# Patient Record
Sex: Male | Born: 2005 | Race: White | Hispanic: No | Marital: Single | Smoking: Never smoker
Health system: Southern US, Community
[De-identification: ages and names within clinical notes are randomized; demographics above are authoritative.]

## PROBLEM LIST (undated history)

## (undated) DIAGNOSIS — F419 Anxiety disorder, unspecified: Secondary | ICD-10-CM

## (undated) DIAGNOSIS — J309 Allergic rhinitis, unspecified: Secondary | ICD-10-CM

## (undated) DIAGNOSIS — L309 Dermatitis, unspecified: Secondary | ICD-10-CM

## (undated) DIAGNOSIS — F32A Depression, unspecified: Secondary | ICD-10-CM

## (undated) DIAGNOSIS — Z0101 Encounter for examination of eyes and vision with abnormal findings: Secondary | ICD-10-CM

## (undated) HISTORY — DX: Allergic rhinitis, unspecified: J30.9

## (undated) HISTORY — DX: Dermatitis, unspecified: L30.9

## (undated) HISTORY — PX: CIRCUMCISION: SUR203

## (undated) HISTORY — DX: Encounter for examination of eyes and vision with abnormal findings: Z01.01

## (undated) HISTORY — DX: Anxiety disorder, unspecified: F41.9

## (undated) HISTORY — DX: Depression, unspecified: F32.A

---
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
## 2024-03-08 ENCOUNTER — Ambulatory Visit: Admitting: Pediatrics

## 2024-03-08 DIAGNOSIS — Z0000 Encounter for general adult medical examination without abnormal findings: Secondary | ICD-10-CM

## 2024-03-28 ENCOUNTER — Encounter: Admitting: Pediatrics

## 2024-03-29 ENCOUNTER — Encounter: Admitting: Pediatrics

## 2024-03-30 ENCOUNTER — Telehealth: Payer: Self-pay | Admitting: Pediatrics

## 2024-03-30 NOTE — Telephone Encounter (Signed)
 Patient stated he forgot about upcoming appointment. Rescheduled for the next available day.   Parent informed of No Show Policy. No Show Policy states that a patient may be dismissed from the practice after 3 missed well check appointments in a rolling calendar year. No show appointments are well child check appointments that are missed (no show or cancelled/rescheduled < 24hrs prior to appointment). The parent(s)/guardian will be notified of each missed appointment. The office administrator will review the chart prior to a decision being made. If a patient is dismissed due to No Shows, [facility] will continue to see that patient for 30 days for sick visits. Parent/caregiver verbalized understanding of policy.

## 2024-04-02 ENCOUNTER — Ambulatory Visit: Payer: Self-pay | Admitting: Pediatrics

## 2024-04-02 ENCOUNTER — Encounter: Payer: Self-pay | Admitting: Pediatrics

## 2024-04-02 VITALS — BP 124/68 | Ht 68.0 in | Wt 124.0 lb

## 2024-04-02 DIAGNOSIS — F902 Attention-deficit hyperactivity disorder, combined type: Secondary | ICD-10-CM

## 2024-04-02 MED ORDER — LISDEXAMFETAMINE DIMESYLATE 50 MG PO CAPS: 50.0000 mg | ORAL_CAPSULE | Freq: Every day | ORAL | 0 refills | Status: DC

## 2024-04-02 NOTE — Progress Notes (Signed)
 ADHD meds refilled after normal weight and Blood pressure. Doing well on present dose. See again in 3 months.  Meds ordered this encounter  Medications   lisdexamfetamine (VYVANSE ) 50 MG capsule    Sig: Take 1 capsule (50 mg total) by mouth daily.    Dispense:  30 capsule    Refill:  0   lisdexamfetamine (VYVANSE ) 50 MG capsule    Sig: Take 1 capsule (50 mg total) by mouth daily.    Dispense:  30 capsule    Refill:  0    DO NOT FILL PRIOR TO 05/02/24   lisdexamfetamine (VYVANSE ) 50 MG capsule    Sig: Take 1 capsule (50 mg total) by mouth daily.    Dispense:  30 capsule    Refill:  0    DO NOT FILL PRIOR TO 06/02/24

## 2024-04-02 NOTE — Patient Instructions (Signed)

## 2024-04-09 ENCOUNTER — Telehealth: Payer: Self-pay | Admitting: Pediatrics

## 2024-04-09 MED ORDER — LISDEXAMFETAMINE DIMESYLATE 50 MG PO CAPS: 50.0000 mg | ORAL_CAPSULE | Freq: Every day | ORAL | 0 refills | Status: DC

## 2024-04-09 NOTE — Telephone Encounter (Signed)
 Father called requesting medication (Vyvanse ) be sent to another pharmacy.   Medication: lisdexamfetamine (Vyvanse )  New Pharmacy: [facility] [address]

## 2024-04-09 NOTE — Telephone Encounter (Signed)
 Medication sent to [facility] [address]

## 2024-07-03 ENCOUNTER — Ambulatory Visit: Payer: Self-pay | Admitting: Pediatrics

## 2024-07-03 VITALS — BP 136/78 | Ht 67.0 in | Wt 120.2 lb

## 2024-07-03 DIAGNOSIS — F902 Attention-deficit hyperactivity disorder, combined type: Secondary | ICD-10-CM

## 2024-07-04 ENCOUNTER — Encounter: Payer: Self-pay | Admitting: Pediatrics

## 2024-07-04 MED ORDER — LISDEXAMFETAMINE DIMESYLATE 50 MG PO CAPS: 50.0000 mg | ORAL_CAPSULE | Freq: Every day | ORAL | 0 refills | Status: AC

## 2024-07-04 MED ORDER — LISDEXAMFETAMINE DIMESYLATE 50 MG PO CAPS: 50.0000 mg | ORAL_CAPSULE | Freq: Every day | ORAL | 0 refills | Status: DC

## 2024-07-04 NOTE — Progress Notes (Signed)
 ADHD meds refilled after normal weight and Blood pressure. Doing well on present dose. See again in 3 months.   Meds ordered this encounter  Medications   lisdexamfetamine  (VYVANSE ) 50 MG capsule    Sig: Take 1 capsule (50 mg total) by mouth daily.    Dispense:  30 capsule    Refill:  0    DO NOT FILL PRIOR TO 08/01/2024   lisdexamfetamine  (VYVANSE ) 50 MG capsule    Sig: Take 1 capsule (50 mg total) by mouth daily.    Dispense:  30 capsule    Refill:  0   DISCONTD: lisdexamfetamine  (VYVANSE ) 50 MG capsule    Sig: Take 1 capsule (50 mg total) by mouth daily.    Dispense:  30 capsule    Refill:  0    DO NOT FILL PRIOR TO 08/01/24   lisdexamfetamine  (VYVANSE ) 50 MG capsule    Sig: Take 1 capsule (50 mg total) by mouth daily.    Dispense:  30 capsule    Refill:  0    DO NOT FILL PRIOR TO 09/01/24

## 2024-07-04 NOTE — Patient Instructions (Signed)
 ADHD (Attention-Deficit/Hyperactivity Disorder) in Adults: What to Know Attention deficit hyperactivity disorder (ADHD) is a mental health disorder that starts during childhood. For many people with ADHD, the disorder continues into the adult years. Treatment can help you manage your symptoms. There are three main types of ADHD: Inattentive. With this type, adults have difficulty paying attention. This may affect cognitive abilities. Hyperactive-impulsive. With this type, adults have a lot of energy and have difficulty controlling their behavior. Combination type. Some people may have symptoms of both types. What are the causes? The exact cause of ADHD is not known. Most experts believe a person's genes and environment possibly contribute to ADHD. What increases the risk? The following factors may make you more likely to develop this condition: Having a first-degree relative such as a parent, brother, or sister, with the condition. Being born before 37 weeks of pregnancy (prematurely) or at a low birth weight. Being born to a mother who smoked tobacco or drank alcohol during pregnancy. Having experienced a brain injury. Being exposed to lead or other toxins in the womb or early in life. What are the signs or symptoms? Symptoms of this condition depend on the type of ADHD. Symptoms of the inattentive type include: Difficulty paying attention or following instructions. Often making simple mistakes. Being disorganized. Avoiding tasks that require time and attention. Losing and forgetting things. Symptoms of the hyperactive-impulsive type include: Restlessness. Talking out of turn, interrupting others, or talking too much. Difficulty with: Sitting still. Feeling motivated. Relaxing. Waiting in line or waiting for a turn. People with the combination type have symptoms of both of the other types. In adults, this condition may lead to certain problems, such as: Keeping jobs. Performing  tasks at work. Having stable relationships. Being on time or keeping to a schedule. How is this diagnosed? This condition is diagnosed based on your current symptoms and your history of symptoms. The diagnosis can be made by a health care provider such as a primary care provider or a mental health care specialist. Your health care provider may use a symptom checklist or a behavior rating scale to evaluate your symptoms. Your health care provider may also want to talk with people who have observed your behaviors throughout your life. How is this treated? This condition can be treated with medicines and behavior therapy. Medicines may be the best option to reduce impulsive behaviors and improve attention. Your health care provider may recommend: Stimulant medicines. These are the most common medicines used for adult ADHD. They affect certain chemicals in the brain (neurotransmitters) and improve your ability to control your symptoms. A non-stimulant medicine. These medicines can also improve focus, attention, and impulsive behavior. It may take weeks to months to see the effects of this medicine. Counseling and behavioral management are also important for treating ADHD. Counseling is often used along with medicine. Your health care provider may suggest: Cognitive behavioral therapy (CBT). This type of therapy teaches you to replace negative thoughts and actions with positive thoughts and actions. When used as part of ADHD treatment, this therapy may also include: Coping strategies for organization, time management, impulse control, and stress reduction. Mindfulness and meditation training. Behavioral management. You may work with a psychologist, occupational who is specially trained to help people with ADHD manage and organize activities and function more effectively. Follow these instructions at home: Medicines  Take over-the-counter and prescription medicines only as told by your health care provider. Talk with your  health care provider about the possible side effects of  your medicines and how to manage them. Alcohol use Do not drink alcohol if: Your health care provider tells you not to drink. You are pregnant, may be pregnant, or are planning to become pregnant. If you drink alcohol: Limit how much you use to: 0-1 drink a day for women. 0-2 drinks a day for men. Know how much alcohol is in your drink. In the [place] one drink equals one 12 oz bottle of beer (355 mL), one 5 oz glass of wine (148 mL), or one 1 oz glass of hard liquor (44 mL). Lifestyle  Do not use illegal drugs. Get enough sleep. Eat a healthy diet. Exercise regularly. Exercise can help to reduce stress and anxiety. General instructions Learn as much as you can about adult ADHD, and work closely with your health care providers to find the treatments that work best for you. Follow the same schedule each day. Use reminder devices like notes, calendars, and phone apps to stay on time and organized. Keep all follow-up visits. Your health care provider will need to monitor your condition and adjust your treatment over time. Where to find more information A health care provider may be able to recommend resources that are available online or over the phone. You could start with: Attention Deficit Disorder Association (ADDA): hotternames.[email or web address] [employer] of Mental Health [facility]): [email or web address].com Contact a health care provider if: Your symptoms continue to cause problems. You have side effects from your medicine, such as: Repeated muscle twitches, coughing, or speech outbursts. Sleep problems. Loss of appetite. Dizziness. Unusually fast heartbeat. Stomach pains. Headaches. You are struggling with anxiety, depression, or substance abuse. Get help right away if: You have a severe reaction to a medicine. This symptom may be an emergency. Get help right away. Call 911. Do not wait to see if the symptom will go away. Do not drive yourself to  the hospital. Take one of these steps if you feel like you may hurt yourself or others, or have thoughts about taking your own life: Go to your nearest emergency room. Call 911. Call the National Suicide Prevention Lifeline at 301 675 2092 or 988. This is open 24 hours a day Text the Crisis Text Line at 703-151-1376. Summary ADHD is a mental health disorder that starts during childhood and often continues into your adult years. The exact cause of ADHD is not known. Most experts believe genetics and environmental factors contribute to ADHD. There is no cure for ADHD, but treatment with medicine, cognitive behavioral therapy, or behavioral management can help you manage your condition. This information is not intended to replace advice given to you by your health care provider. Make sure you discuss any questions you have with your health care provider. Document Revised: 03/22/2024 Document Reviewed: 09/04/2021 Elsevier Patient Education  2025 [employer].

## 2024-10-08 ENCOUNTER — Ambulatory Visit: Payer: Self-pay | Admitting: Pediatrics
